# Patient Record
Sex: Male | Born: 1985 | Race: Black or African American | Hispanic: No | Marital: Single | Smoking: Current every day smoker
Health system: Southern US, Community
[De-identification: ages and names within clinical notes are randomized; demographics above are authoritative.]

## PROBLEM LIST (undated history)

## (undated) DIAGNOSIS — J45909 Unspecified asthma, uncomplicated: Secondary | ICD-10-CM

## (undated) DIAGNOSIS — M419 Scoliosis, unspecified: Secondary | ICD-10-CM

---
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
## 2018-08-13 ENCOUNTER — Encounter (HOSPITAL_BASED_OUTPATIENT_CLINIC_OR_DEPARTMENT_OTHER): Payer: Self-pay

## 2018-08-13 ENCOUNTER — Emergency Department (HOSPITAL_BASED_OUTPATIENT_CLINIC_OR_DEPARTMENT_OTHER): Payer: Self-pay

## 2018-08-13 ENCOUNTER — Emergency Department (HOSPITAL_BASED_OUTPATIENT_CLINIC_OR_DEPARTMENT_OTHER)
Admission: EM | Admit: 2018-08-13 | Discharge: 2018-08-13 | Disposition: A | Discharge status: Home / Self Care | Payer: Self-pay | Attending: Emergency Medicine | Admitting: Emergency Medicine

## 2018-08-13 ENCOUNTER — Other Ambulatory Visit: Payer: Self-pay

## 2018-08-13 DIAGNOSIS — J020 Streptococcal pharyngitis: Secondary | ICD-10-CM | POA: Insufficient documentation

## 2018-08-13 DIAGNOSIS — F17210 Nicotine dependence, cigarettes, uncomplicated: Secondary | ICD-10-CM | POA: Insufficient documentation

## 2018-08-13 HISTORY — DX: Scoliosis, unspecified: M41.9

## 2018-08-13 LAB — GROUP A STREP BY PCR: Group A Strep by PCR: DETECTED — AB

## 2018-08-13 MED ORDER — PENICILLIN G BENZATHINE 1200000 UNIT/2ML IM SUSP
1.2000 10*6.[IU] | Freq: Once | INTRAMUSCULAR | Status: AC
  Administered 2018-08-13: 1.2 10*6.[IU] via INTRAMUSCULAR
  Filled 2018-08-13: qty 2

## 2018-08-13 MED ORDER — LIDOCAINE VISCOUS HCL 2 % MT SOLN
15.0000 mL | Freq: Once | OROMUCOSAL | Status: AC
  Administered 2018-08-13: 15 mL via OROMUCOSAL
  Filled 2018-08-13: qty 15

## 2018-08-13 MED ORDER — DEXAMETHASONE 1 MG/ML PO CONC
10.0000 mg | Freq: Once | ORAL | Status: AC
  Administered 2018-08-13: 10 mg via ORAL
  Filled 2018-08-13: qty 10

## 2018-08-13 MED ORDER — ONDANSETRON 4 MG PO TBDP: 4.0000 mg | ORAL_TABLET | Freq: Three times a day (TID) | ORAL | 0 refills | Status: DC | PRN

## 2018-08-13 MED ORDER — DEXAMETHASONE 6 MG PO TABS
ORAL_TABLET | ORAL | Status: AC
  Filled 2018-08-13: qty 1

## 2018-08-13 MED ORDER — DEXAMETHASONE 4 MG PO TABS
ORAL_TABLET | ORAL | Status: AC
  Filled 2018-08-13: qty 1

## 2018-08-13 MED ORDER — ONDANSETRON 4 MG PO TBDP
4.0000 mg | ORAL_TABLET | Freq: Once | ORAL | Status: AC
  Administered 2018-08-13: 4 mg via ORAL
  Filled 2018-08-13: qty 1

## 2018-08-13 MED ORDER — LIDOCAINE VISCOUS HCL 2 % MT SOLN: 15.0000 mL | OROMUCOSAL | 0 refills | Status: AC | PRN

## 2018-08-13 MED ORDER — ACETAMINOPHEN 325 MG PO TABS
650.0000 mg | ORAL_TABLET | Freq: Once | ORAL | Status: AC
  Administered 2018-08-13: 650 mg via ORAL
  Filled 2018-08-13: qty 2

## 2018-08-13 NOTE — ED Provider Notes (Signed)
[facility] EMERGENCY DEPARTMENT Provider Note   CSN: [id number] Arrival date & time: 08/13/18  1517     History   Chief Complaint Chief Complaint  Patient presents with  . Cough    HPI Nicholas Ortiz is a 33 y.o. male presented today with 4-day history of rhinorrhea, congestion, sore throat and cough.  Patient states that symptoms began gradually and have progressed over the past 4 days.  He states that he has been using a assortment of medication without relief.  He states that he did use Tylenol yesterday with some relief however has not used any today.  Patient's main complaint is his sore throat, bilateral burning sensation constant worse with swallowing, moderate intensity.  Patient states cough is mildly productive with yellow sputum, denies hemoptysis, shortness of breath or chest pain.  Patient does endorse nausea, he states that he had one episode of nonbloody/nonbilious emesis on Friday, 3 days ago without recurrence of emesis.  He endorses nausea at time of my evaluation, denies abdominal pain or diarrhea.  Patient states that he has felt warm at home however denies measured fever.  HPI  Past Medical History:  Diagnosis Date  . Scoliosis     There are no active problems to display for this patient.   History reviewed. No pertinent surgical history.      Home Medications    Prior to Admission medications   Medication Sig Start Date End Date Taking? Authorizing Provider  lidocaine (XYLOCAINE) 2 % solution Use as directed 15 mLs in the mouth or throat as needed for mouth pain (Do NOT swallow). 08/13/18   Harlene Salts A, PA-C  ondansetron (ZOFRAN ODT) 4 MG disintegrating tablet Take 1 tablet (4 mg total) by mouth every 8 (eight) hours as needed for nausea or vomiting. 08/13/18   Bill Salinas, PA-C    Family History No family history on file.  Social History Social History   Tobacco Use  . Smoking status: Current Every Day Smoker   Types: Cigarettes  . Smokeless tobacco: Never Used  Substance Use Topics  . Alcohol use: Yes    Comment: occ  . Drug use: Never     Allergies   Patient has no known allergies.   Review of Systems Review of Systems  Constitutional: Positive for fever (Subjective). Negative for chills.  HENT: Positive for congestion, rhinorrhea and sore throat. Negative for drooling, facial swelling, sinus pain, trouble swallowing and voice change.   Eyes: Negative.  Negative for visual disturbance.  Respiratory: Positive for cough. Negative for shortness of breath.   Cardiovascular: Negative.  Negative for chest pain and leg swelling.  Gastrointestinal: Positive for nausea. Negative for abdominal pain, blood in stool, diarrhea and vomiting (One episode 3 days ago, resolved).  Musculoskeletal: Positive for arthralgias and myalgias. Negative for gait problem, neck pain and neck stiffness.       Generalized body aches without focal pain  Skin: Negative.  Negative for rash.  Neurological: Negative.  Negative for dizziness, weakness and headaches.   Physical Exam Updated Vital Signs BP (!) 141/96   Pulse 86   Temp 99.8 F (37.7 C) (Oral)   Resp 18   Ht 5\' 9"  (1.753 m)   Wt 57.6 kg   SpO2 96%   BMI 18.75 kg/m   Physical Exam Constitutional:      General: He is not in acute distress.    Appearance: Normal appearance. He is well-developed. He is not ill-appearing or diaphoretic.  HENT:     Head: Normocephalic and atraumatic.     Right Ear: Hearing, tympanic membrane, ear canal and external ear normal.     Left Ear: Hearing, tympanic membrane, ear canal and external ear normal.     Nose: Congestion and rhinorrhea present. Rhinorrhea is clear.     Right Sinus: No maxillary sinus tenderness or frontal sinus tenderness.     Left Sinus: No maxillary sinus tenderness or frontal sinus tenderness.     Mouth/Throat:     Lips: Pink.     Mouth: Mucous membranes are moist.     Pharynx: Uvula midline.       Tonsils: Swelling: 1+ on the right. 1+ on the left.     Comments: The patient has normal phonation and is in control of secretions. No stridor.  Midline uvula without edema. Soft palate rises symmetrically. Mild tonsillar erythema and swelling without exudates. Tongue protrusion is normal, floor of mouth is soft. No trismus. No creptius on neck palpation. No gingival erythema or fluctuance noted. Mucus membranes moist.  Eyes:     General: Vision grossly intact. Gaze aligned appropriately.     Extraocular Movements: Extraocular movements intact.     Conjunctiva/sclera: Conjunctivae normal.     Pupils: Pupils are equal, round, and reactive to light.  Neck:     Musculoskeletal: Full passive range of motion without pain, normal range of motion and neck supple. No neck rigidity or crepitus.     Trachea: Trachea and phonation normal. No tracheal tenderness or tracheal deviation.  Cardiovascular:     Rate and Rhythm: Normal rate and regular rhythm.     Heart sounds: Normal heart sounds.  Pulmonary:     Effort: Pulmonary effort is normal. No respiratory distress.     Breath sounds: Normal breath sounds and air entry. No rhonchi.  Chest:     Chest wall: No tenderness.  Abdominal:     General: Bowel sounds are normal.     Palpations: Abdomen is soft.     Tenderness: There is no abdominal tenderness. There is no guarding or rebound.  Musculoskeletal: Normal range of motion.     Comments: Ambulatory without assistance  Skin:    General: Skin is warm and dry.     Capillary Refill: Capillary refill takes less than 2 seconds.  Neurological:     Mental Status: He is alert.     GCS: GCS eye subscore is 4. GCS verbal subscore is 5. GCS motor subscore is 6.     Comments: Speech is clear and goal oriented, follows commands Major Cranial nerves without deficit, no facial droop Moves extremities without ataxia, coordination intact Normal gait  Psychiatric:        Mood and Affect: Mood normal.         Behavior: Behavior normal.    ED Treatments / Results  Labs (all labs ordered are listed, but only abnormal results are displayed) Labs Reviewed  GROUP A STREP BY PCR - Abnormal; Notable for the following components:      Result Value   Group A Strep by PCR DETECTED (*)    All other components within normal limits    EKG None  Radiology Dg Chest 2 View  Result Date: 08/13/2018 CLINICAL DATA:  Flu like symptoms for 4 days. Cough and sore throat. Chills and body aches. EXAM: CHEST - 2 VIEW COMPARISON:  None. FINDINGS: Heart size is normal. Lungs are clear. Scoliosis is noted. Radiopaque densities of the patient's hair  drapes over the lung apices. IMPRESSION: No active cardiopulmonary disease. Electronically Signed   By: Marin Robertshristopher  Mattern M.D.   On: 08/13/2018 16:52    Procedures Procedures (including critical care time)  Medications Ordered in ED Medications  dexamethasone (DECADRON) 4 MG tablet (  Canceled Entry 08/13/18 1905)  dexamethasone (DECADRON) 6 MG tablet (  Canceled Entry 08/13/18 1905)  ondansetron (ZOFRAN-ODT) disintegrating tablet 4 mg (4 mg Oral Given 08/13/18 1745)  lidocaine (XYLOCAINE) 2 % viscous mouth solution 15 mL (15 mLs Mouth/Throat Given 08/13/18 1745)  acetaminophen (TYLENOL) tablet 650 mg (650 mg Oral Given 08/13/18 1744)  penicillin g benzathine (BICILLIN LA) 1200000 UNIT/2ML injection 1.2 Million Units (1.2 Million Units Intramuscular Given 08/13/18 1839)  dexamethasone (DECADRON) 1 MG/ML solution 10 mg (10 mg Oral Given 08/13/18 1839)     Initial Impression / Assessment and Plan / ED Course  I have reviewed the triage vital signs and the nursing notes.  Pertinent labs & imaging results that were available during my care of the patient were reviewed by me and considered in my medical decision making (see chart for details).    [age] year old otherwise healthy male presenting with 4-day history of URI-like illness. Patient's CXR is negative for acute  infiltrate. Strep test positive. Treated in the Ed with steroids, tylenol, viscous lidocaine and PCN IM.  Patient does not appear dehydrated but we did discussed importance of water rehydration.  Presentation not concerning for peritonsillar abscess, Ludwig's angina, retropharyngeal abscess or other deep tissue infections of the head or neck.  There is no trismus or uvula deviation.  Patient given viscous lidocaine for symptomatic relief.  Informed for anti-inflammatory OTC use for symptomatic relief.  Rest and hydration strongly encouraged.  Patient is eating and drinking emergency department without difficulty.  Additionally patient notes some nausea, one episode of vomiting 3 days ago, no abdominal pain.  Patient was given small amount of Zofran for his nausea.  Abdomen nonacute with no peritoneal signs or tenderness. Patient is non-toxic appearing, sitting comfortably on examination table.  No indication of appendicitis, bowel obstruction, bowel perforation, cholecystitis, or diverticulitis.  Suspect patient's nausea secondary from his viral illness.  At this time there does not appear to be any evidence of an acute emergency medical condition and the patient appears stable for discharge with appropriate outpatient follow up. Diagnosis was discussed with patient who verbalizes understanding of care plan and is agreeable to discharge. I have discussed return precautions with patient  who verbalizes understanding of return precautions. Patient strongly encouraged to follow-up with their PCP this week. All questions answered.  Note: Portions of this report may have been transcribed using voice recognition software. Every effort was made to ensure accuracy; however, inadvertent computerized transcription errors may still be present. Final Clinical Impressions(s) / ED Diagnoses   Final diagnoses:  Strep pharyngitis    ED Discharge Orders         Ordered    ondansetron (ZOFRAN ODT) 4 MG  disintegrating tablet  Every 8 hours PRN     08/13/18 1920    lidocaine (XYLOCAINE) 2 % solution  As needed     08/13/18 1920           Elizabeth PalauMorelli, Shereda Graw A, PA-C 08/13/18 Barnett Applebaum1936    Haviland, Julie, MD 08/13/18 [phone]

## 2018-08-13 NOTE — Discharge Instructions (Addendum)
You have been diagnosed today with strep throat.   At this time there does not appear to be the presence of an emergent medical condition, however there is always the potential for conditions to change. Please read and follow the below instructions.  Please return to the Emergency Department immediately for any new or worsening symptoms or if your symptoms do not improve within 2 days. Please be sure to follow up with your Primary Care Provider this week possible regarding your visit today; please call their office to schedule an appointment even if you are feeling better for a follow-up visit. You may use the viscous lidocaine solution to help with your sore throat.  Rinse and spit this medication out, do not swallow it. Additionally you may use the medication Zofran as prescribed to help with nausea, this medication dissolves under your tongue. You may continue to use over-the-counter Tylenol and ibuprofen as directed on the packaging to help with your symptoms. You have been treated today with an antibiotic medication for the bacterial infection of your throat, if you do not feel improved within 48 hours and please return to the emergency department.  Get help right away if: You have new symptoms, such as vomiting, severe headache, stiff or painful neck, chest pain, or shortness of breath. You have severe throat pain, drooling, or changes in your voice. You have swelling of the neck, or the skin on the neck becomes red and tender. You have signs of dehydration, such as fatigue, dry mouth, and decreased urination. You become increasingly sleepy, or you cannot wake up completely. Your joints become red or painful. You have fever  Please read the additional information packets attached to your discharge summary.  Do not take your medicine if  develop an itchy rash, swelling in your mouth or lips, or difficulty breathing.

## 2018-08-13 NOTE — ED Triage Notes (Signed)
C/o flu like sx x 4 days-NAD-steady gait 

## 2018-08-13 NOTE — ED Notes (Signed)
Patient transported to X-ray 

## 2018-09-22 ENCOUNTER — Other Ambulatory Visit: Payer: Self-pay

## 2018-09-22 ENCOUNTER — Encounter (HOSPITAL_BASED_OUTPATIENT_CLINIC_OR_DEPARTMENT_OTHER): Payer: Self-pay | Admitting: Emergency Medicine

## 2018-09-22 ENCOUNTER — Emergency Department (HOSPITAL_BASED_OUTPATIENT_CLINIC_OR_DEPARTMENT_OTHER)
Admission: EM | Admit: 2018-09-22 | Discharge: 2018-09-22 | Disposition: A | Discharge status: Home / Self Care | Payer: Self-pay | Attending: Emergency Medicine | Admitting: Emergency Medicine

## 2018-09-22 DIAGNOSIS — J111 Influenza due to unidentified influenza virus with other respiratory manifestations: Secondary | ICD-10-CM | POA: Insufficient documentation

## 2018-09-22 DIAGNOSIS — J45909 Unspecified asthma, uncomplicated: Secondary | ICD-10-CM | POA: Insufficient documentation

## 2018-09-22 DIAGNOSIS — F17210 Nicotine dependence, cigarettes, uncomplicated: Secondary | ICD-10-CM | POA: Insufficient documentation

## 2018-09-22 DIAGNOSIS — R6889 Other general symptoms and signs: Secondary | ICD-10-CM

## 2018-09-22 HISTORY — DX: Unspecified asthma, uncomplicated: J45.909

## 2018-09-22 MED ORDER — OSELTAMIVIR PHOSPHATE 75 MG PO CAPS: 75.0000 mg | ORAL_CAPSULE | Freq: Two times a day (BID) | ORAL | 0 refills | Status: AC

## 2018-09-22 MED ORDER — ALBUTEROL SULFATE HFA 108 (90 BASE) MCG/ACT IN AERS: 1.0000 | INHALATION_SPRAY | Freq: Four times a day (QID) | RESPIRATORY_TRACT | 0 refills | Status: AC | PRN

## 2018-09-22 MED ORDER — ONDANSETRON 4 MG PO TBDP: 4.0000 mg | ORAL_TABLET | Freq: Three times a day (TID) | ORAL | 0 refills | Status: AC | PRN

## 2018-09-22 MED ORDER — BENZONATATE 100 MG PO CAPS: 100.0000 mg | ORAL_CAPSULE | Freq: Three times a day (TID) | ORAL | 0 refills | Status: AC | PRN

## 2018-09-22 MED ORDER — FLUTICASONE PROPIONATE 50 MCG/ACT NA SUSP: 2.0000 | Freq: Every day | NASAL | 0 refills | Status: AC

## 2018-09-22 NOTE — ED Triage Notes (Signed)
Pt here with flu-like sx since yesterday. He states he already had the flu this season.

## 2018-09-22 NOTE — Discharge Instructions (Signed)

## 2018-09-22 NOTE — ED Provider Notes (Signed)
Emergency Department Provider Note   I have reviewed the triage vital signs and the nursing notes.   HISTORY  Chief Complaint flu-like symptoms   HPI Nicholas Ortiz is a 33 y.o. male with PMH of asthma and tobacco use presents to the emergency department with flulike symptoms starting yesterday.  The patient states he was diagnosed with flu several months ago and this feels the same.  He has developed cough, congestion, headache, nausea, and body aches.  Symptoms are moderate to severe.  He notes some mild increased shortness of breath but not severe.  No chest pain.  No abdominal discomfort or diarrhea.  No radiation of symptoms or other modifying factors.   Past Medical History:  Diagnosis Date  . Asthma   . Scoliosis     There are no active problems to display for this patient.   History reviewed. No pertinent surgical history.   Allergies Patient has no known allergies.  History reviewed. No pertinent family history.  Social History Social History   Tobacco Use  . Smoking status: Current Every Day Smoker    Types: Cigarettes  . Smokeless tobacco: Never Used  Substance Use Topics  . Alcohol use: Yes    Comment: occ  . Drug use: Never    Review of Systems  Constitutional: Positive fever/chills and body aches.  Eyes: No visual changes. ENT: No sore throat. Positive congestion.  Cardiovascular: Denies chest pain. Respiratory: Mild shortness of breath and cough.  Gastrointestinal: No abdominal pain. Positive nausea, no vomiting.  No diarrhea.  No constipation. Genitourinary: Negative for dysuria. Musculoskeletal: Negative for back pain. Skin: Negative for rash. Neurological: Negative for focal weakness or numbness. Positive HA.   10-point ROS otherwise negative.  ____________________________________________   PHYSICAL EXAM:  VITAL SIGNS: ED Triage Vitals  Enc Vitals Group     BP 09/22/18 0853 (!) 137/99     Pulse Rate 09/22/18 0853 (!) 56    Resp 09/22/18 0853 16     Temp 09/22/18 0853 98.9 F (37.2 C)     Temp Source 09/22/18 0853 Oral     SpO2 09/22/18 0853 100 %     Weight 09/22/18 0853 125 lb (56.7 kg)     Height 09/22/18 0853 5\' 9"  (1.753 m)   Constitutional: Alert and oriented. Well appearing and in no acute distress. Eyes: Conjunctivae are normal. Head: Atraumatic. Nose: Positive congestion/rhinnorhea. Mouth/Throat: Mucous membranes are moist.   Neck: No stridor.  Cardiovascular: Normal rate, regular rhythm. Good peripheral circulation. Grossly normal heart sounds.   Respiratory: Normal respiratory effort. No retractions. Lungs CTAB. Gastrointestinal: Soft and nontender. No distention.  Musculoskeletal: No lower extremity tenderness nor edema. No gross deformities of extremities. Neurologic:  Normal speech and language. No gross focal neurologic deficits are appreciated.  Skin:  Skin is warm, dry and intact. No rash noted.  ____________________________________________  RADIOLOGY  None  ____________________________________________   PROCEDURES  Procedure(s) performed:   Procedures  None  ____________________________________________   INITIAL IMPRESSION / ASSESSMENT AND PLAN / ED COURSE  Pertinent labs & imaging results that were available during my care of the patient were reviewed by me and considered in my medical decision making (see chart for details).  Patient presents to the emergency department with flulike symptoms which began yesterday.  No clinical signs or symptoms to suspect developing sepsis, pneumonia, strep throat, or serious bacterial infection.  Plan to treat empirically with Tamiflu and other supportive medications.  Describes some shortness of breath but is  breathing at a normal rate, speaking in full sentences, and in no apparent distress.  No chest pain.  Discussed the side effect profile of Tamiflu and patient would like to take this medication.  Provided additional medicines for  symptom relief.   ____________________________________________  FINAL CLINICAL IMPRESSION(S) / ED DIAGNOSES  Final diagnoses:  Flu-like symptoms    NEW OUTPATIENT MEDICATIONS STARTED DURING THIS VISIT:  New Prescriptions   ALBUTEROL (PROVENTIL HFA;VENTOLIN HFA) 108 (90 BASE) MCG/ACT INHALER    Inhale 1-2 puffs into the lungs every 6 (six) hours as needed for wheezing or shortness of breath.   BENZONATATE (TESSALON) 100 MG CAPSULE    Take 1 capsule (100 mg total) by mouth 3 (three) times daily as needed for cough.   FLUTICASONE (FLONASE) 50 MCG/ACT NASAL SPRAY    Place 2 sprays into both nostrils daily for 7 days.   ONDANSETRON (ZOFRAN ODT) 4 MG DISINTEGRATING TABLET    Take 1 tablet (4 mg total) by mouth every 8 (eight) hours as needed.   OSELTAMIVIR (TAMIFLU) 75 MG CAPSULE    Take 1 capsule (75 mg total) by mouth every 12 (twelve) hours for 5 days.    Note:  This document was prepared using Dragon voice recognition software and may include unintentional dictation errors.  Alona Bene, MD Emergency Medicine    Sabryna Lahm, Arlyss Repress, MD 09/22/18 [phone]

## 2020-01-27 ENCOUNTER — Other Ambulatory Visit: Payer: Self-pay

## 2020-01-27 ENCOUNTER — Encounter (HOSPITAL_COMMUNITY): Payer: Self-pay | Admitting: Emergency Medicine

## 2020-01-27 ENCOUNTER — Emergency Department (HOSPITAL_COMMUNITY): Payer: No Typology Code available for payment source

## 2020-01-27 ENCOUNTER — Emergency Department (HOSPITAL_COMMUNITY)
Admission: EM | Admit: 2020-01-27 | Discharge: 2020-01-27 | Disposition: A | Discharge status: Home / Self Care | Payer: No Typology Code available for payment source | Attending: Emergency Medicine | Admitting: Emergency Medicine

## 2020-01-27 DIAGNOSIS — M62838 Other muscle spasm: Secondary | ICD-10-CM | POA: Diagnosis not present

## 2020-01-27 DIAGNOSIS — M542 Cervicalgia: Secondary | ICD-10-CM | POA: Diagnosis not present

## 2020-01-27 DIAGNOSIS — F17210 Nicotine dependence, cigarettes, uncomplicated: Secondary | ICD-10-CM | POA: Diagnosis not present

## 2020-01-27 DIAGNOSIS — R079 Chest pain, unspecified: Secondary | ICD-10-CM | POA: Insufficient documentation

## 2020-01-27 DIAGNOSIS — Y9389 Activity, other specified: Secondary | ICD-10-CM | POA: Diagnosis not present

## 2020-01-27 DIAGNOSIS — Y929 Unspecified place or not applicable: Secondary | ICD-10-CM | POA: Insufficient documentation

## 2020-01-27 DIAGNOSIS — J45909 Unspecified asthma, uncomplicated: Secondary | ICD-10-CM | POA: Diagnosis not present

## 2020-01-27 DIAGNOSIS — Y998 Other external cause status: Secondary | ICD-10-CM | POA: Diagnosis not present

## 2020-01-27 DIAGNOSIS — M549 Dorsalgia, unspecified: Secondary | ICD-10-CM | POA: Diagnosis present

## 2020-01-27 MED ORDER — ALBUTEROL SULFATE HFA 108 (90 BASE) MCG/ACT IN AERS
2.0000 | INHALATION_SPRAY | Freq: Once | RESPIRATORY_TRACT | Status: AC
  Administered 2020-01-27: 2 via RESPIRATORY_TRACT
  Filled 2020-01-27: qty 6.7

## 2020-01-27 MED ORDER — CYCLOBENZAPRINE HCL 10 MG PO TABS: 10.0000 mg | ORAL_TABLET | Freq: Two times a day (BID) | ORAL | 0 refills | Status: AC | PRN

## 2020-01-27 NOTE — ED Notes (Signed)
Pt given dc instructions pt verbalizes understanding.  
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
## 2020-05-26 ENCOUNTER — Emergency Department (HOSPITAL_BASED_OUTPATIENT_CLINIC_OR_DEPARTMENT_OTHER): Payer: Self-pay

## 2020-05-26 ENCOUNTER — Other Ambulatory Visit (HOSPITAL_BASED_OUTPATIENT_CLINIC_OR_DEPARTMENT_OTHER): Payer: Self-pay | Admitting: Emergency Medicine

## 2020-05-26 ENCOUNTER — Emergency Department (HOSPITAL_BASED_OUTPATIENT_CLINIC_OR_DEPARTMENT_OTHER)
Admission: EM | Admit: 2020-05-26 | Discharge: 2020-05-26 | Disposition: A | Discharge status: Home / Self Care | Payer: Self-pay | Attending: Emergency Medicine | Admitting: Emergency Medicine

## 2020-05-26 ENCOUNTER — Encounter (HOSPITAL_BASED_OUTPATIENT_CLINIC_OR_DEPARTMENT_OTHER): Payer: Self-pay | Admitting: Emergency Medicine

## 2020-05-26 ENCOUNTER — Other Ambulatory Visit: Payer: Self-pay

## 2020-05-26 DIAGNOSIS — Z20822 Contact with and (suspected) exposure to covid-19: Secondary | ICD-10-CM | POA: Insufficient documentation

## 2020-05-26 DIAGNOSIS — J45909 Unspecified asthma, uncomplicated: Secondary | ICD-10-CM | POA: Insufficient documentation

## 2020-05-26 DIAGNOSIS — J029 Acute pharyngitis, unspecified: Secondary | ICD-10-CM | POA: Insufficient documentation

## 2020-05-26 DIAGNOSIS — F17210 Nicotine dependence, cigarettes, uncomplicated: Secondary | ICD-10-CM | POA: Insufficient documentation

## 2020-05-26 LAB — GROUP A STREP BY PCR: Group A Strep by PCR: NOT DETECTED

## 2020-05-26 LAB — CBC WITH DIFFERENTIAL/PLATELET
Abs Immature Granulocytes: 0 10*3/uL (ref 0.00–0.07)
Band Neutrophils: 3 %
Basophils Absolute: 0 10*3/uL (ref 0.0–0.1)
Basophils Relative: 0 %
Eosinophils Absolute: 0 10*3/uL (ref 0.0–0.5)
Eosinophils Relative: 0 %
HCT: 43.6 % (ref 39.0–52.0)
Hemoglobin: 15.2 g/dL (ref 13.0–17.0)
Lymphocytes Relative: 3 %
Lymphs Abs: 0.9 10*3/uL (ref 0.7–4.0)
MCH: 28 pg (ref 26.0–34.0)
MCHC: 34.9 g/dL (ref 30.0–36.0)
MCV: 80.4 fL (ref 80.0–100.0)
Monocytes Absolute: 3.5 10*3/uL — ABNORMAL HIGH (ref 0.1–1.0)
Monocytes Relative: 12 %
Neutro Abs: 24.5 10*3/uL — ABNORMAL HIGH (ref 1.7–7.7)
Neutrophils Relative %: 82 %
Platelets: 349 10*3/uL (ref 150–400)
RBC: 5.42 MIL/uL (ref 4.22–5.81)
RDW: 13.4 % (ref 11.5–15.5)
Smear Review: NORMAL
WBC: 28.8 10*3/uL — ABNORMAL HIGH (ref 4.0–10.5)
nRBC: 0 % (ref 0.0–0.2)

## 2020-05-26 LAB — BASIC METABOLIC PANEL
Anion gap: 13 (ref 5–15)
BUN: 9 mg/dL (ref 6–20)
CO2: 25 mmol/L (ref 22–32)
Calcium: 9.3 mg/dL (ref 8.9–10.3)
Chloride: 96 mmol/L — ABNORMAL LOW (ref 98–111)
Creatinine, Ser: 1.27 mg/dL — ABNORMAL HIGH (ref 0.61–1.24)
GFR, Estimated: >60 mL/min (ref >60)
Glucose, Bld: 113 mg/dL — ABNORMAL HIGH (ref 70–99)
Potassium: 4.2 mmol/L (ref 3.5–5.1)
Sodium: 134 mmol/L — ABNORMAL LOW (ref 135–145)

## 2020-05-26 LAB — RESPIRATORY PANEL BY RT PCR (FLU A&B, COVID)
Influenza A by PCR: NEGATIVE
Influenza B by PCR: NEGATIVE
SARS Coronavirus 2 by RT PCR: NEGATIVE

## 2020-05-26 MED ORDER — DEXAMETHASONE SODIUM PHOSPHATE 10 MG/ML IJ SOLN
10.0000 mg | Freq: Once | INTRAMUSCULAR | Status: AC
Start: 2020-05-26 — End: 2020-05-26
  Administered 2020-05-26: 10 mg via INTRAVENOUS
  Filled 2020-05-26: qty 1

## 2020-05-26 MED ORDER — SODIUM CHLORIDE 0.9 % IV SOLN
INTRAVENOUS | Status: DC | PRN
  Administered 2020-05-26: 250 mL via INTRAVENOUS

## 2020-05-26 MED ORDER — CLINDAMYCIN PHOSPHATE 600 MG/50ML IV SOLN
600.0000 mg | Freq: Once | INTRAVENOUS | Status: AC
  Administered 2020-05-26: 600 mg via INTRAVENOUS
  Filled 2020-05-26: qty 50

## 2020-05-26 MED ORDER — CLINDAMYCIN HCL 150 MG PO CAPS: 150.0000 mg | ORAL_CAPSULE | Freq: Four times a day (QID) | ORAL | 0 refills | Status: DC

## 2020-05-26 MED ORDER — IOHEXOL 300 MG/ML  SOLN
100.0000 mL | Freq: Once | INTRAMUSCULAR | Status: AC | PRN
  Administered 2020-05-26: 76 mL via INTRAVENOUS

## 2020-05-26 MED ORDER — KETOROLAC TROMETHAMINE 30 MG/ML IJ SOLN
30.0000 mg | Freq: Once | INTRAMUSCULAR | Status: AC
  Administered 2020-05-26: 30 mg via INTRAVENOUS
  Filled 2020-05-26: qty 1

## 2020-05-26 MED FILL — CLINDAMYCIN HCL 150 MG CAPS: 150 | 10 days supply | Qty: 40 | Fill #0

## 2020-05-26 NOTE — ED Provider Notes (Signed)
[facility] EMERGENCY DEPARTMENT Provider Note   CSN: [id number] Arrival date & time: 05/26/20  0751     History Chief Complaint  Patient presents with  . Sore Throat    Nicholas Ortiz is a 34 y.o. male.  He has no significant past medical history.  Complaining of pain in his throat has been going on about a week although worse over the last 3 days.  Causes difficulty swallowing.  Denies any fever.  Has tried nothing for it.  Has not been Covid vaccinated.  The history is provided by the patient.  Sore Throat This is a new problem. The current episode started more than 1 week ago. The problem occurs constantly. The problem has been gradually worsening. Pertinent negatives include no chest pain, no abdominal pain, no headaches and no shortness of breath. The symptoms are aggravated by swallowing. Nothing relieves the symptoms. He has tried nothing for the symptoms. The treatment provided no relief.       Past Medical History:  Diagnosis Date  . Asthma   . Scoliosis     There are no problems to display for this patient.   History reviewed. No pertinent surgical history.     History reviewed. No pertinent family history.  Social History   Tobacco Use  . Smoking status: Current Every Day Smoker    Types: Cigarettes  . Smokeless tobacco: Never Used  Vaping Use  . Vaping Use: Never used  Substance Use Topics  . Alcohol use: Yes    Comment: occ  . Drug use: Never    Home Medications Prior to Admission medications   Medication Sig Start Date End Date Taking? Authorizing Provider  albuterol (PROVENTIL HFA;VENTOLIN HFA) 108 (90 Base) MCG/ACT inhaler Inhale 1-2 puffs into the lungs every 6 (six) hours as needed for wheezing or shortness of breath. 09/22/18   Long, Arlyss Repress, MD  benzonatate (TESSALON) 100 MG capsule Take 1 capsule (100 mg total) by mouth 3 (three) times daily as needed for cough. Patient not taking: Reported on 01/27/2020 09/22/18   Long,  Arlyss Repress, MD  cyclobenzaprine (FLEXERIL) 10 MG tablet Take 1 tablet (10 mg total) by mouth 2 (two) times daily as needed for muscle spasms. 01/27/20   Tegeler, Canary Brim, MD  fluticasone (FLONASE) 50 MCG/ACT nasal spray Place 2 sprays into both nostrils daily for 7 days. Patient not taking: Reported on 01/27/2020 09/22/18 01/27/20  Long, Arlyss Repress, MD  lidocaine (XYLOCAINE) 2 % solution Use as directed 15 mLs in the mouth or throat as needed for mouth pain (Do NOT swallow). Patient not taking: Reported on 01/27/2020 08/13/18   Harlene Salts A, PA-C  ondansetron (ZOFRAN ODT) 4 MG disintegrating tablet Take 1 tablet (4 mg total) by mouth every 8 (eight) hours as needed. Patient not taking: Reported on 01/27/2020 09/22/18   Long, Arlyss Repress, MD    Allergies    Patient has no known allergies.  Review of Systems   Review of Systems  Constitutional: Negative for fever.  HENT: Positive for sore throat and trouble swallowing.   Eyes: Negative for visual disturbance.  Respiratory: Negative for shortness of breath.   Cardiovascular: Negative for chest pain.  Gastrointestinal: Negative for abdominal pain.  Musculoskeletal: Positive for neck pain.  Skin: Negative for rash.  Neurological: Negative for headaches.    Physical Exam Updated Vital Signs BP (!) 131/54 (BP Location: Right Arm)   Pulse 100   Resp 18   Ht 5\' 9"  (1.753  m)   Wt 65.8 kg   SpO2 100%   BMI 21.41 kg/m   Physical Exam Vitals and nursing note reviewed.  Constitutional:      Appearance: He is well-developed.  HENT:     Head: Normocephalic and atraumatic.     Mouth/Throat:     Mouth: Mucous membranes are moist.     Pharynx: Uvula midline. Pharyngeal swelling and posterior oropharyngeal erythema present. No oropharyngeal exudate.     Tonsils: No tonsillar exudate. 2+ on the right. 3+ on the left.  Eyes:     Conjunctiva/sclera: Conjunctivae normal.  Cardiovascular:     Rate and Rhythm: Normal rate and regular rhythm.      Heart sounds: No murmur heard.   Pulmonary:     Effort: Pulmonary effort is normal. No respiratory distress.     Breath sounds: Normal breath sounds.  Abdominal:     Palpations: Abdomen is soft.     Tenderness: There is no abdominal tenderness.  Musculoskeletal:     Cervical back: Neck supple.  Skin:    General: Skin is warm and dry.     Capillary Refill: Capillary refill takes less than 2 seconds.  Neurological:     General: No focal deficit present.     Mental Status: He is alert.     ED Results / Procedures / Treatments   Labs (all labs ordered are listed, but only abnormal results are displayed) Labs Reviewed  BASIC METABOLIC PANEL - Abnormal; Notable for the following components:      Result Value   Sodium 134 (*)    Chloride 96 (*)    Glucose, Bld 113 (*)    Creatinine, Ser 1.27 (*)    All other components within normal limits  CBC WITH DIFFERENTIAL/PLATELET - Abnormal; Notable for the following components:   WBC 28.8 (*)    Neutro Abs 24.5 (*)    Monocytes Absolute 3.5 (*)    All other components within normal limits  GROUP A STREP BY PCR  RESPIRATORY PANEL BY RT PCR (FLU A&B, COVID)    EKG None  Radiology CT Soft Tissue Neck W Contrast  Result Date: 05/26/2020 CLINICAL DATA:  Sore throat with difficulty swallowing EXAM: CT NECK WITH CONTRAST TECHNIQUE: Multidetector CT imaging of the neck was performed using the standard protocol following the bolus administration of intravenous contrast. CONTRAST:  61mL OMNIPAQUE IOHEXOL 300 MG/ML  SOLN COMPARISON:  None. FINDINGS: Pharynx and larynx: Enlargement of the palatine tonsils with areas of ill-defined low-attenuation. Adjacent oropharyngeal wall thickening. There is narrowing of the oropharyngeal airway, which remains patent. Infiltration of adjacent parapharyngeal fat. Probable retropharyngeal effusion. Larynx is unremarkable. Salivary glands: Unremarkable. Thyroid: Normal. Lymph nodes: Top normal and mildly enlarged  cervical lymph nodes, which are likely reactive. Vascular: Major neck vessels are patent. Limited intracranial: No abnormal enhancement. Visualized orbits: Unremarkable. Mastoids and visualized paranasal sinuses: Minor mucosal thickening. Mastoids are clear. Skeleton: No significant abnormality. Upper chest: Included upper lungs are clear. Other: None. IMPRESSION: Swelling and abnormal density involving the left greater than right palatine tonsils and adjacent oropharynx consistent with tonsillitis. Phlegmon is present without discrete abscess at this time. Probable associated retropharyngeal effusion. Oropharyngeal airway is narrowed but patent. Probable associated retropharyngeal effusion. Electronically Signed   By: Guadlupe Spanish M.D.   On: 05/26/2020 09:36    Procedures Procedures (including critical care time)  Medications Ordered in ED Medications  dexamethasone (DECADRON) injection 10 mg (has no administration in time range)  clindamycin (CLEOCIN)  IVPB 600 mg (has no administration in time range)    ED Course  I have reviewed the triage vital signs and the nursing notes.  Pertinent labs & imaging results that were available during my care of the patient were reviewed by me and considered in my medical decision making (see chart for details).  Clinical Course as of May 26 1741  Tue May 26, 2020  0945 CT is read as tonsillitis and associated phlegmon left greater than right but no discrete abscess.  He is received fluids, Toradol, Decadron and clindamycin.  Currently he is tolerating his secretions and I feel that he can be safely discharged on antibiotics to follow-up with ENT.   [MB]    Clinical Course User Index [MB] Terrilee Files, MD   MDM Rules/Calculators/A&P                         Nicholas Ortiz was evaluated in Emergency Department on 05/26/2020 for the symptoms described in the history of present illness. He was evaluated in the context of the global COVID-19  pandemic, which necessitated consideration that the patient might be at risk for infection with the SARS-CoV-2 virus that causes COVID-19. Institutional protocols and algorithms that pertain to the evaluation of patients at risk for COVID-19 are in a state of rapid change based on information released by regulatory bodies including the CDC and federal and state organizations. These policies and algorithms were followed during the patient's care in the ED.  This patient complains of throat pain; this involves an extensive number of treatment Options and is a complaint that carries with it a high risk of complications and Morbidity. The differential includes strep throat, mono, Covid, peritonsillar abscess, cellulitis, RPA  I ordered, reviewed and interpreted labs, which included CBC with elevated white count, normal hemoglobin, chemistries fairly normal other than mild elevation of creatinine, strep test negative, Covid testing and flu testing negative I ordered medication IV fluids and steroids, IV antibiotics, IV Toradol I ordered imaging studies which included CT neck soft tissue with contrast and I independently    visualized and interpreted imaging which showed tonsillar enlargement and associated phlegmon without abscess Previous records obtained and reviewed in epic, no recent visits  After the interventions stated above, I reevaluated the patient and found patient symptoms to be improved.  I reviewed his results with him.  Recommended antibiotics and other symptomatic care.  Recommended follow-up with ENT.  Return instructions discussed.  Final Clinical Impression(s) / ED Diagnoses Final diagnoses:  Acute pharyngitis, unspecified etiology    Rx / DC Orders ED Discharge Orders         Ordered    clindamycin (CLEOCIN) 150 MG capsule  4 times daily        05/26/20 1019           Terrilee Files, MD 05/26/20 1745

## 2020-05-26 NOTE — Discharge Instructions (Signed)
You were seen in the emergency department for worsening sore throat.  Your Covid and strep test were negative.  You had a CAT scan that showed enlargement of your tonsils but no obvious abscess.  We are treating you with antibiotics.  Please continue to take Tylenol and ibuprofen as needed for pain.  Warm salt water gargles.  Follow-up with Dr. Ezzard Standing in ENT or return to the emergency department if your symptoms are worsening.

## 2020-05-26 NOTE — ED Triage Notes (Signed)
Pt arrives pov with c/o sore throat with difficulty swallowing x 3 days. Redness and swelling noted to tonsils. Pt endorses tenderness to neck

## 2020-05-28 ENCOUNTER — Other Ambulatory Visit: Payer: Self-pay

## 2020-05-28 ENCOUNTER — Emergency Department (HOSPITAL_BASED_OUTPATIENT_CLINIC_OR_DEPARTMENT_OTHER)
Admission: EM | Admit: 2020-05-28 | Discharge: 2020-05-28 | Disposition: A | Discharge status: Home / Self Care | Payer: Self-pay | Attending: Emergency Medicine | Admitting: Emergency Medicine

## 2020-05-28 ENCOUNTER — Encounter (HOSPITAL_BASED_OUTPATIENT_CLINIC_OR_DEPARTMENT_OTHER): Payer: Self-pay | Admitting: *Deleted

## 2020-05-28 ENCOUNTER — Other Ambulatory Visit (HOSPITAL_BASED_OUTPATIENT_CLINIC_OR_DEPARTMENT_OTHER): Payer: Self-pay | Admitting: Emergency Medicine

## 2020-05-28 DIAGNOSIS — F17210 Nicotine dependence, cigarettes, uncomplicated: Secondary | ICD-10-CM | POA: Insufficient documentation

## 2020-05-28 DIAGNOSIS — J45909 Unspecified asthma, uncomplicated: Secondary | ICD-10-CM | POA: Insufficient documentation

## 2020-05-28 DIAGNOSIS — J039 Acute tonsillitis, unspecified: Secondary | ICD-10-CM | POA: Insufficient documentation

## 2020-05-28 MED ORDER — PREDNISONE 10 MG PO TABS: 20.0000 mg | ORAL_TABLET | Freq: Two times a day (BID) | ORAL | 0 refills | Status: DC

## 2020-05-28 MED ORDER — CLINDAMYCIN HCL 300 MG PO CAPS: 300.0000 mg | ORAL_CAPSULE | Freq: Four times a day (QID) | ORAL | 0 refills | Status: DC

## 2020-05-28 MED ORDER — DEXAMETHASONE SODIUM PHOSPHATE 10 MG/ML IJ SOLN
10.0000 mg | Freq: Once | INTRAMUSCULAR | Status: AC
  Administered 2020-05-28: 10 mg via INTRAMUSCULAR
  Filled 2020-05-28: qty 1

## 2020-05-28 MED ORDER — TRAMADOL HCL 50 MG PO TABS: 50.0000 mg | ORAL_TABLET | Freq: Four times a day (QID) | ORAL | 0 refills | Status: DC | PRN

## 2020-05-28 MED FILL — CLINDAMYCIN HCL 300 MG CAP: 300 | 7 days supply | Qty: 28 | Fill #0

## 2020-05-28 MED FILL — traMADol HCL 50 MG TABS: 50 | 3 days supply | Qty: 12 | Fill #0

## 2020-05-28 MED FILL — predniSONE 10 MG TABS: 10 | 5 days supply | Qty: 20 | Fill #0

## 2020-05-28 NOTE — ED Provider Notes (Signed)
[facility] EMERGENCY DEPARTMENT Provider Note   CSN: [id number] Arrival date & time: 05/28/20  0554     History Chief Complaint  Patient presents with  . Sore Throat    Nicholas Ortiz is a 34 y.o. male.  Patient is a 34 year old male with history of asthma presenting with complaints of throat pain.  Patient was diagnosed with tonsillitis 2 days ago here after undergoing laboratory studies, strep test, Covid test, and CT scan of the neck.  These were all negative except for possible phlegmon on the CT scan, but no frank abscess.  He returns today with pain unrelieved with clindamycin.  He denies fevers or chills.  He denies difficulty breathing or swallowing, but does describe pain when he swallows.  The history is provided by the patient.  Sore Throat This is a new problem. The current episode started 2 days ago. The problem occurs constantly. The problem has been gradually worsening. The symptoms are aggravated by swallowing. Nothing relieves the symptoms. He has tried water (Clindamycin) for the symptoms. The treatment provided no relief.       Past Medical History:  Diagnosis Date  . Asthma   . Scoliosis     There are no problems to display for this patient.   History reviewed. No pertinent surgical history.     No family history on file.  Social History   Tobacco Use  . Smoking status: Current Every Day Smoker    Types: Cigarettes  . Smokeless tobacco: Never Used  Vaping Use  . Vaping Use: Never used  Substance Use Topics  . Alcohol use: Yes    Comment: occ  . Drug use: Never    Home Medications Prior to Admission medications   Medication Sig Start Date End Date Taking? Authorizing Provider  albuterol (PROVENTIL HFA;VENTOLIN HFA) 108 (90 Base) MCG/ACT inhaler Inhale 1-2 puffs into the lungs every 6 (six) hours as needed for wheezing or shortness of breath. 09/22/18   Long, Arlyss Repress, MD  benzonatate (TESSALON) 100 MG capsule Take 1 capsule  (100 mg total) by mouth 3 (three) times daily as needed for cough. Patient not taking: Reported on 01/27/2020 09/22/18   Long, Arlyss Repress, MD  clindamycin (CLEOCIN) 150 MG capsule Take 1 capsule (150 mg total) by mouth 4 (four) times daily. 05/26/20   Terrilee Files, MD  cyclobenzaprine (FLEXERIL) 10 MG tablet Take 1 tablet (10 mg total) by mouth 2 (two) times daily as needed for muscle spasms. 01/27/20   Tegeler, Canary Brim, MD  fluticasone (FLONASE) 50 MCG/ACT nasal spray Place 2 sprays into both nostrils daily for 7 days. Patient not taking: Reported on 01/27/2020 09/22/18 01/27/20  Long, Arlyss Repress, MD  lidocaine (XYLOCAINE) 2 % solution Use as directed 15 mLs in the mouth or throat as needed for mouth pain (Do NOT swallow). Patient not taking: Reported on 01/27/2020 08/13/18   Harlene Salts A, PA-C  ondansetron (ZOFRAN ODT) 4 MG disintegrating tablet Take 1 tablet (4 mg total) by mouth every 8 (eight) hours as needed. Patient not taking: Reported on 01/27/2020 09/22/18   Long, Arlyss Repress, MD    Allergies    Patient has no known allergies.  Review of Systems   Review of Systems  All other systems reviewed and are negative.   Physical Exam Updated Vital Signs BP (!) 126/102 (BP Location: Right Arm)   Pulse 67   Temp 98.7 F (37.1 C) (Oral)   Resp 16   Ht 5'  9" (1.753 m)   Wt 65.8 kg   SpO2 100%   BMI 21.42 kg/m   Physical Exam Vitals and nursing note reviewed.  Constitutional:      General: He is not in acute distress.    Appearance: He is well-developed. He is not diaphoretic.  HENT:     Head: Normocephalic and atraumatic.     Mouth/Throat:     Mouth: Mucous membranes are moist.     Pharynx: Oropharyngeal exudate and posterior oropharyngeal erythema present.     Tonsils: Tonsillar exudate present. No tonsillar abscesses. 2+ on the right. 2+ on the left.     Comments: There is swelling and erythema of both tonsils.  There are exudates noted.  There is no asymmetry of the  tonsils or deviation.  There is no stridor. Cardiovascular:     Rate and Rhythm: Normal rate and regular rhythm.     Heart sounds: No murmur heard.  No friction rub.  Pulmonary:     Effort: Pulmonary effort is normal. No respiratory distress.     Breath sounds: Normal breath sounds. No wheezing or rales.  Abdominal:     General: Bowel sounds are normal. There is no distension.     Palpations: Abdomen is soft.     Tenderness: There is no abdominal tenderness.  Musculoskeletal:        General: Normal range of motion.     Cervical back: Normal range of motion and neck supple.  Skin:    General: Skin is warm and dry.  Neurological:     Mental Status: He is alert and oriented to person, place, and time.     Coordination: Coordination normal.     ED Results / Procedures / Treatments   Labs (all labs ordered are listed, but only abnormal results are displayed) Labs Reviewed - No data to display  EKG None  Radiology CT Soft Tissue Neck W Contrast  Result Date: 05/26/2020 CLINICAL DATA:  Sore throat with difficulty swallowing EXAM: CT NECK WITH CONTRAST TECHNIQUE: Multidetector CT imaging of the neck was performed using the standard protocol following the bolus administration of intravenous contrast. CONTRAST:  68mL OMNIPAQUE IOHEXOL 300 MG/ML  SOLN COMPARISON:  None. FINDINGS: Pharynx and larynx: Enlargement of the palatine tonsils with areas of ill-defined low-attenuation. Adjacent oropharyngeal wall thickening. There is narrowing of the oropharyngeal airway, which remains patent. Infiltration of adjacent parapharyngeal fat. Probable retropharyngeal effusion. Larynx is unremarkable. Salivary glands: Unremarkable. Thyroid: Normal. Lymph nodes: Top normal and mildly enlarged cervical lymph nodes, which are likely reactive. Vascular: Major neck vessels are patent. Limited intracranial: No abnormal enhancement. Visualized orbits: Unremarkable. Mastoids and visualized paranasal sinuses: Minor  mucosal thickening. Mastoids are clear. Skeleton: No significant abnormality. Upper chest: Included upper lungs are clear. Other: None. IMPRESSION: Swelling and abnormal density involving the left greater than right palatine tonsils and adjacent oropharynx consistent with tonsillitis. Phlegmon is present without discrete abscess at this time. Probable associated retropharyngeal effusion. Oropharyngeal airway is narrowed but patent. Probable associated retropharyngeal effusion. Electronically Signed   By: Guadlupe Spanish M.D.   On: 05/26/2020 09:36    Procedures Procedures (including critical care time)  Medications Ordered in ED Medications  dexamethasone (DECADRON) injection 10 mg (has no administration in time range)    ED Course  I have reviewed the triage vital signs and the nursing notes.  Pertinent labs & imaging results that were available during my care of the patient were reviewed by me and considered in my  medical decision making (see chart for details).    MDM Rules/Calculators/A&P  Patient recently diagnosed with tonsillitis returning with ongoing pain.  He was prescribed clindamycin 150 mg 4 times daily for 10 days.  I will have him increase this to 300 mg 4 times daily.  He also received Decadron in the ER, but was not discharged with an oral steroid.  I think he may benefit from prednisone as well.  Patient will also be prescribed medication for discomfort as his throat appears quite inflamed and red.  He is to return as needed.  Final Clinical Impression(s) / ED Diagnoses Final diagnoses:  None    Rx / DC Orders ED Discharge Orders    None       Geoffery Lyons, MD 05/28/20 [phone]

## 2020-05-28 NOTE — ED Triage Notes (Signed)
Pt reports throat pain. Says he was seen here for the same recently, taking abx and gargling with salt water without pain relief.

## 2020-05-28 NOTE — Discharge Instructions (Addendum)
Increase your dose of clindamycin to 300 mg 4 times daily.  You have been provided a prescription to extend the course of your antibiotic by 2 more days.  Begin taking prednisone as prescribed and tramadol as prescribed as needed for pain.  Follow-up with primary doctor/ENT as previously recommended at your last visit, and return to the ER if you develop difficulty breathing or swallowing, or other new and concerning symptoms.

## 2020-11-07 IMAGING — DX DG CHEST 2V
2 series · 2 of 2 positions shown · non-contrast
Comparison: None.

CLINICAL DATA: Flu like symptoms for 4 days. Cough and sore throat.
Chills and body aches.

EXAM:
CHEST - 2 VIEW

[chest pa]
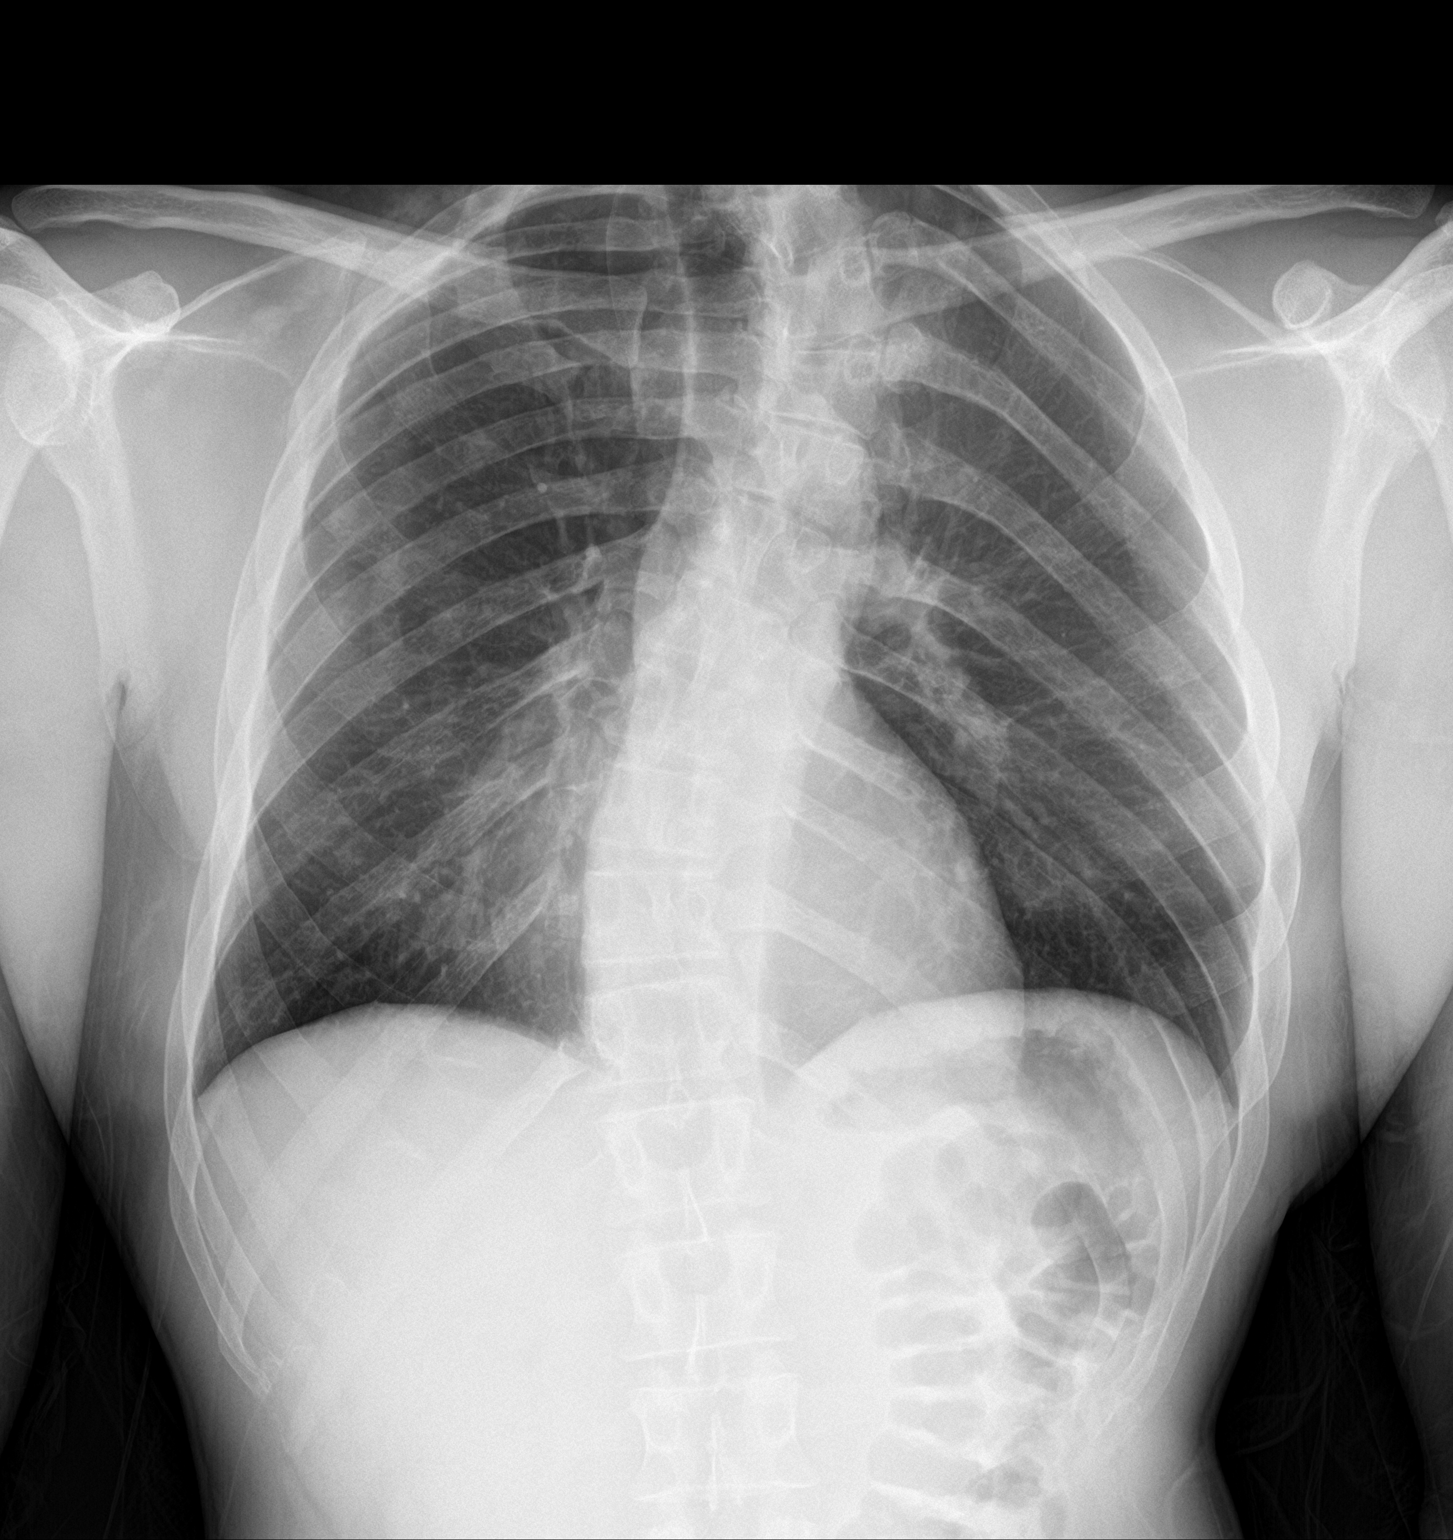

[chest lat]
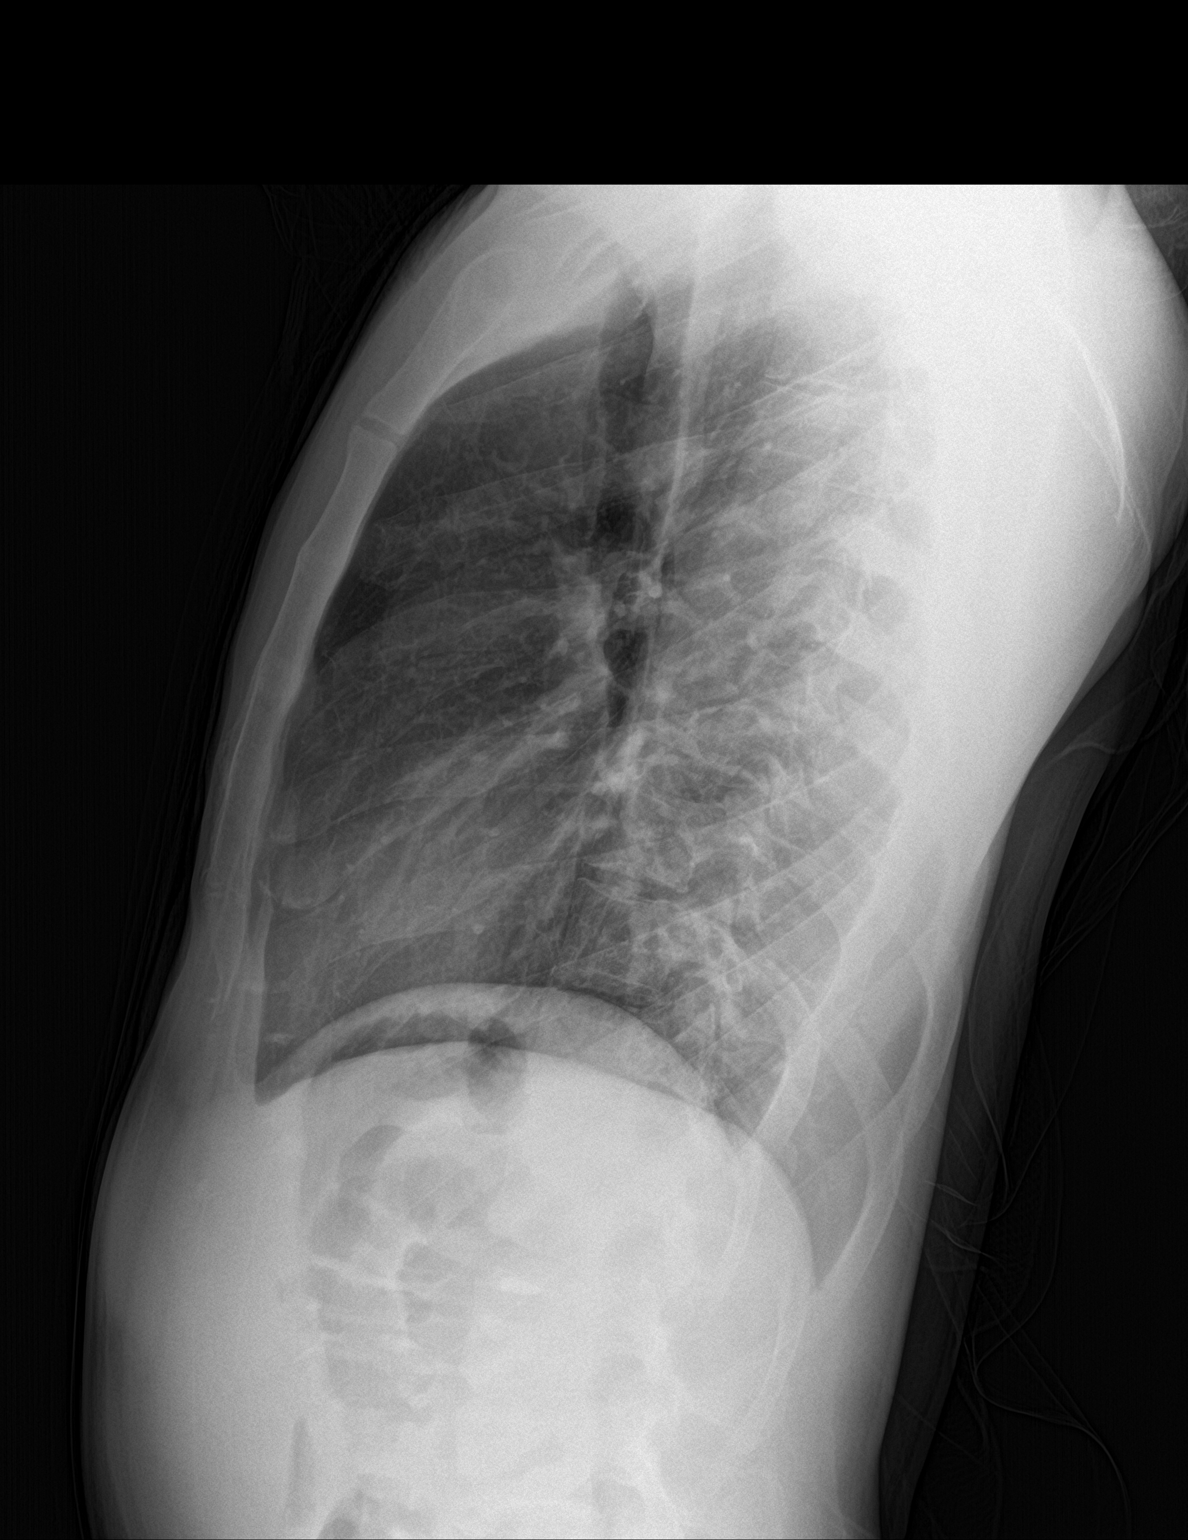

[2 of 2 positions shown; findings below may reference images not displayed]

FINDINGS: Heart size is normal. Lungs are clear. Scoliosis is noted.
Radiopaque densities of the patient's hair drapes over the lung
apices.
IMPRESSION: No active cardiopulmonary disease.
